# Patient Record
Sex: Female | Born: 1955 | Race: White | Hispanic: No | Marital: Single | Smoking: Current some day smoker
Health system: Southern US, Community
[De-identification: ages and names within clinical notes are randomized; demographics above are authoritative.]

## PROBLEM LIST (undated history)

## (undated) DIAGNOSIS — E119 Type 2 diabetes mellitus without complications: Secondary | ICD-10-CM

## (undated) DIAGNOSIS — I639 Cerebral infarction, unspecified: Secondary | ICD-10-CM

## (undated) DIAGNOSIS — I10 Essential (primary) hypertension: Secondary | ICD-10-CM

## (undated) DIAGNOSIS — I2510 Atherosclerotic heart disease of native coronary artery without angina pectoris: Secondary | ICD-10-CM

## (undated) DIAGNOSIS — N289 Disorder of kidney and ureter, unspecified: Secondary | ICD-10-CM

## (undated) HISTORY — PX: ABDOMINAL HYSTERECTOMY: SHX81

## (undated) HISTORY — PX: CHOLECYSTECTOMY: SHX55

## (undated) HISTORY — PX: OTHER SURGICAL HISTORY: SHX169

## (undated) HISTORY — PX: ORTHOPEDIC SURGERY: SHX850

## (undated) HISTORY — PX: HERNIA REPAIR: SHX51

---

## 2013-05-31 ENCOUNTER — Ambulatory Visit: Payer: Self-pay

## 2014-05-15 ENCOUNTER — Ambulatory Visit: Payer: Self-pay | Admitting: Family Medicine

## 2014-05-15 LAB — URINALYSIS, COMPLETE: WBC UR: >30 /HPF (ref 0–5)

## 2014-06-09 ENCOUNTER — Ambulatory Visit: Payer: Self-pay | Admitting: Physician Assistant

## 2015-05-06 ENCOUNTER — Ambulatory Visit
Admission: EM | Admit: 2015-05-06 | Discharge: 2015-05-06 | Disposition: A | Discharge status: Home / Self Care | Payer: Medicare Other | Attending: Emergency Medicine | Admitting: Emergency Medicine

## 2015-05-06 ENCOUNTER — Encounter: Payer: Self-pay | Admitting: Emergency Medicine

## 2015-05-06 DIAGNOSIS — H6992 Unspecified Eustachian tube disorder, left ear: Secondary | ICD-10-CM

## 2015-05-06 DIAGNOSIS — J320 Chronic maxillary sinusitis: Secondary | ICD-10-CM

## 2015-05-06 HISTORY — DX: Disorder of kidney and ureter, unspecified: N28.9

## 2015-05-06 HISTORY — DX: Essential (primary) hypertension: I10

## 2015-05-06 HISTORY — DX: Cerebral infarction, unspecified: I63.9

## 2015-05-06 HISTORY — DX: Atherosclerotic heart disease of native coronary artery without angina pectoris: I25.10

## 2015-05-06 HISTORY — DX: Type 2 diabetes mellitus without complications: E11.9

## 2015-05-06 MED ORDER — CEFUROXIME AXETIL 250 MG PO TABS: ORAL_TABLET | ORAL | Status: DC

## 2015-05-06 MED ORDER — CEFUROXIME AXETIL 250 MG PO TABS: 500.0000 mg | ORAL_TABLET | Freq: Two times a day (BID) | ORAL | Status: DC

## 2015-05-06 NOTE — ED Notes (Signed)
Left ear xdays

## 2015-05-06 NOTE — Discharge Instructions (Signed)

## 2015-05-06 NOTE — ED Provider Notes (Signed)
CSN: [id number]     Arrival date & time 05/06/15  1151 History   None    Chief Complaint  Patient presents with  . Otalgia   (Consider location/radiation/quality/duration/timing/severity/associated sxs/prior Treatment) HPI   This a 59 year old female who presents with a two-week history of earache sore throat and sinus infection. Her primary care physician prescribed amoxicillin for her sinus infection which she states is chronic chief in 7 days but did not find any improvement. Today she presents with left ear pain that extends down into her throat. She denies any fever or chills. She does have a copious amount of green yellow sputum from her nose. She is not coughing. She is on chronic Flonase and Nettie pot rinse and has had several episodes of a sinus infection on a yearly basis. Been considered for sinus surgery but because of the possibility of stroke she has refused that procedure. Comorbidities include diabetes kidney disease high blood pressure peripheral vascular disease and MI  Past Medical History  Diagnosis Date  . Diabetes mellitus without complication (HCC)   . Renal disorder   . Hypertension   . Coronary artery disease   . Stroke [facility])    Past Surgical History  Procedure Laterality Date  . Cholecystectomy    . Abdominal hysterectomy    . Orthopedic surgery    . Hernia repair    . Stents     History reviewed. No pertinent family history. Social History  Substance Use Topics  . Smoking status: Current Some Day Smoker  . Smokeless tobacco: None  . Alcohol Use: No   OB History    No data available     Review of Systems  Constitutional: Positive for activity change. Negative for fever, chills, diaphoresis and fatigue.  HENT: Positive for congestion, ear pain, postnasal drip, rhinorrhea, sinus pressure, sneezing and sore throat.   Musculoskeletal: Positive for arthralgias.  Allergic/Immunologic: Positive for environmental allergies.  All other systems reviewed  and are negative.   Allergies  Codeine and Vicodin  Home Medications   Prior to Admission medications   Medication Sig Start Date End Date Taking? Authorizing Provider  amLODipine-benazepril (LOTREL) 10-20 MG capsule Take 1 capsule by mouth daily.   Yes Historical Provider, MD  carvedilol (COREG CR) 10 MG 24 hr capsule Take 10 mg by mouth 2 (two) times daily.   Yes Historical Provider, MD  clopidogrel (PLAVIX) 75 MG tablet Take 75 mg by mouth daily.   Yes Historical Provider, MD  furosemide (LASIX) 20 MG tablet Take 20 mg by mouth 2 (two) times daily.   Yes Historical Provider, MD  insulin aspart (NOVOLOG) 100 UNIT/ML injection Inject into the skin 3 (three) times daily before meals.   Yes Historical Provider, MD  insulin glargine (LANTUS) 100 UNIT/ML injection Inject 33 Units into the skin at bedtime.   Yes Historical Provider, MD  levothyroxine (SYNTHROID, LEVOTHROID) 125 MCG tablet Take 125 mcg by mouth daily before breakfast.   Yes Historical Provider, MD  losartan (COZAAR) 50 MG tablet Take 50 mg by mouth 2 (two) times daily.   Yes Historical Provider, MD  PARoxetine (PAXIL) 30 MG tablet Take 15 mg by mouth daily.   Yes Historical Provider, MD  rosuvastatin (CRESTOR) 40 MG tablet Take 40 mg by mouth daily.   Yes Historical Provider, MD  spironolactone (ALDACTONE) 50 MG tablet Take 50 mg by mouth daily.   Yes Historical Provider, MD  cefUROXime (CEFTIN) 250 MG tablet Take one tablet by mouth BID with  meals 05/06/15   Lutricia FeilWilliam P Johncarlo Maalouf, PA-C   Meds Ordered and Administered this Visit  Medications - No data to display  BP 116/43 mmHg  Pulse 64  Temp(Src) 97.8 F (36.6 C) (Tympanic)  Resp 20  Ht 5\' 2"  (1.575 m)  Wt 185 lb (83.915 kg)  BMI 33.83 kg/m2  SpO2 99% No data found.   Physical Exam  Constitutional: She is oriented to person, place, and time. She appears well-developed and well-nourished. No distress.  HENT:  Head: Normocephalic and atraumatic.  Right Ear: External  ear normal.  Nose: Nose normal.  Mouth/Throat: Oropharynx is clear and moist. No oropharyngeal exudate.  Left ear shows the left TM to be slightly erythematous with an air fluid level present. She is tenderness along the jawline and into the neck along the sternocleidomastoid.  Eyes: EOM are normal. Pupils are equal, round, and reactive to light. Right eye exhibits no discharge. Left eye exhibits no discharge.  Neck: Neck supple.  Pulmonary/Chest: Effort normal and breath sounds normal. No respiratory distress. She has no wheezes. She has no rales.  Musculoskeletal: Normal range of motion. She exhibits no edema or tenderness.  Lymphadenopathy:    She has no cervical adenopathy.  Neurological: She is alert and oriented to person, place, and time.  Skin: Skin is warm and dry. She is not diaphoretic.  Psychiatric: She has a normal mood and affect. Her behavior is normal. Judgment and thought content normal.  Nursing note and vitals reviewed.   ED Course  Procedures (including critical care time)  Labs Review Labs Reviewed - No data to display  Imaging Review No results found.   Visual Acuity Review  Right Eye Distance:   Left Eye Distance:   Bilateral Distance:    Right Eye Near:   Left Eye Near:    Bilateral Near:         MDM   1. Chronic maxillary sinusitis   2. Eustachian tube disorder, left    Discharge Medication List as of 05/06/2015  1:56 PM    START taking these medications   Details  cefUROXime (CEFTIN) 250 MG tablet Take 2 tablets (500 mg total) by mouth 2 (two) times daily with a meal., Starting 05/06/2015, Until Discontinued, Print       Plan: 1. Diagnosis reviewed with patient 2. rx as per orders; risks, benefits, potential side effects reviewed with patient 3. Recommend supportive treatment with flonase,Netti pot rest and fluids. 4. F/u with PCP.  I discussed with the patient her chronic sinusitis and she was likely has eustachian tube dysfunction  due to the inflammation and retention of fluid in her left ear. I've given her a prescription for Ceftin one above his wrong and should read take 1 250 mg tablet by mouth twice a day with meals. The correct prescription was given to the patient. Will follow up with her primary care physician since this was a failure of the amoxicillin to treat the sinusitis adequately. Follow-up within a week or 2.  Lutricia FeilWilliam P Helios Kohlmann, PA-C 05/06/15 1406

## 2015-06-12 ENCOUNTER — Ambulatory Visit
Admission: EM | Admit: 2015-06-12 | Discharge: 2015-06-12 | Disposition: A | Discharge status: Home / Self Care | Payer: Medicare Other | Attending: Family Medicine | Admitting: Family Medicine

## 2015-06-12 ENCOUNTER — Ambulatory Visit: Payer: Medicare Other

## 2015-06-12 DIAGNOSIS — M7732 Calcaneal spur, left foot: Secondary | ICD-10-CM | POA: Diagnosis not present

## 2015-06-12 DIAGNOSIS — L309 Dermatitis, unspecified: Secondary | ICD-10-CM | POA: Diagnosis not present

## 2015-06-12 DIAGNOSIS — M25472 Effusion, left ankle: Secondary | ICD-10-CM | POA: Diagnosis not present

## 2015-06-12 DIAGNOSIS — M25572 Pain in left ankle and joints of left foot: Secondary | ICD-10-CM | POA: Diagnosis not present

## 2015-06-12 DIAGNOSIS — L03116 Cellulitis of left lower limb: Secondary | ICD-10-CM | POA: Diagnosis not present

## 2015-06-12 DIAGNOSIS — T148 Other injury of unspecified body region: Secondary | ICD-10-CM

## 2015-06-12 DIAGNOSIS — T148XXA Other injury of unspecified body region, initial encounter: Secondary | ICD-10-CM

## 2015-06-12 MED ORDER — CEPHALEXIN 500 MG PO CAPS: 500.0000 mg | ORAL_CAPSULE | Freq: Two times a day (BID) | ORAL | Status: AC

## 2015-06-12 MED ORDER — ACETAMINOPHEN 500 MG PO TABS: 1000.0000 mg | ORAL_TABLET | Freq: Four times a day (QID) | ORAL | Status: AC | PRN

## 2015-06-12 MED ORDER — MUPIROCIN 2 % EX OINT: TOPICAL_OINTMENT | CUTANEOUS | Status: DC

## 2015-06-12 NOTE — ED Notes (Signed)
Patient complains of left ankle pain. Patient has swelling and bruising. She states that she does not remember having an injury to this foot. She states that painful when she pushes at a certain spot and painful to put pressure on leg.

## 2015-06-12 NOTE — ED Provider Notes (Signed)
CSN: [id number]     Arrival date & time 06/12/15  1030 History   First MD Initiated Contact with Patient 06/12/15 1224     Chief Complaint  Patient presents with  . Ankle Pain   (Consider location/radiation/quality/duration/timing/severity/associated sxs/prior Treatment) HPI Comments: Single caucasian female smoker here for evaluation of left ankle pain and swelling denied trauma, rolling ankle pain with ambulation and certain positions wearing slippers today as closed toe shoes more painful smoker, diabetic fingersticks normal 117 today concerned she may have broken a bone and wants xray certain positions no pain at rest without weight bearing started yesterday PCM [facility]  FHx: M-diabetes, hyperlipidemia, HTN, COPD, thyroid, kidney disease;   The history is provided by the patient.    Past Medical History  Diagnosis Date  . Diabetes mellitus without complication (HCC)   . Renal disorder   . Hypertension   . Coronary artery disease   . Stroke [facility])    Past Surgical History  Procedure Laterality Date  . Cholecystectomy    . Abdominal hysterectomy    . Orthopedic surgery    . Hernia repair    . Stents     History reviewed. No pertinent family history. Social History  Substance Use Topics  . Smoking status: Current Some Day Smoker -- 0.50 packs/day for 30 years    Types: Cigarettes  . Smokeless tobacco: None  . Alcohol Use: No   OB History    No data available     Review of Systems  Constitutional: Negative for fever and chills.  HENT: Negative for congestion, ear pain and sore throat.   Eyes: Negative for pain and discharge.  Respiratory: Negative for cough and wheezing.   Cardiovascular: Negative for chest pain and leg swelling.  Gastrointestinal: Negative for nausea, vomiting, diarrhea, constipation and blood in stool.  Endocrine: Negative for polydipsia, polyphagia and polyuria.  Genitourinary: Negative for dysuria, hematuria and difficulty urinating.    Musculoskeletal: Positive for myalgias, joint swelling, arthralgias and gait problem. Negative for back pain, neck pain and neck stiffness.  Skin: Negative for color change, pallor, rash and wound.  Allergic/Immunologic: Negative for environmental allergies and food allergies.  Neurological: Negative for headaches.  Hematological: Negative for adenopathy. Does not bruise/bleed easily.  Psychiatric/Behavioral: Negative for confusion, sleep disturbance and agitation. The patient is not nervous/anxious.     Allergies  Codeine and Vicodin  Home Medications   Prior to Admission medications   Medication Sig Start Date End Date Taking? Authorizing Provider  amLODipine-benazepril (LOTREL) 10-20 MG capsule Take 1 capsule by mouth daily.   Yes Historical Provider, MD  carvedilol (COREG CR) 10 MG 24 hr capsule Take 10 mg by mouth 2 (two) times daily.   Yes Historical Provider, MD  clopidogrel (PLAVIX) 75 MG tablet Take 75 mg by mouth daily.   Yes Historical Provider, MD  furosemide (LASIX) 20 MG tablet Take 20 mg by mouth 2 (two) times daily.   Yes Historical Provider, MD  insulin aspart (NOVOLOG) 100 UNIT/ML injection Inject into the skin 3 (three) times daily before meals.   Yes Historical Provider, MD  insulin glargine (LANTUS) 100 UNIT/ML injection Inject 33 Units into the skin at bedtime.   Yes Historical Provider, MD  levothyroxine (SYNTHROID, LEVOTHROID) 125 MCG tablet Take 125 mcg by mouth daily before breakfast.   Yes Historical Provider, MD  losartan (COZAAR) 50 MG tablet Take 50 mg by mouth 2 (two) times daily.   Yes Historical Provider, MD  PARoxetine (PAXIL)  30 MG tablet Take 15 mg by mouth daily.   Yes Historical Provider, MD  rosuvastatin (CRESTOR) 40 MG tablet Take 40 mg by mouth daily.   Yes Historical Provider, MD  spironolactone (ALDACTONE) 50 MG tablet Take 50 mg by mouth daily.   Yes Historical Provider, MD  acetaminophen (TYLENOL) 500 MG tablet Take 2 tablets (1,000 mg total)  by mouth every 6 (six) hours as needed for mild pain, moderate pain or fever. 06/12/15 06/19/15  Barbaraann Barthel, NP  cephALEXin (KEFLEX) 500 MG capsule Take 1 capsule (500 mg total) by mouth 2 (two) times daily. 06/12/15 06/19/15  Barbaraann Barthel, NP  mupirocin ointment (BACTROBAN) 2 % Apply to left foot/ankle affected area twice a day until healed 06/12/15   Barbaraann Barthel, NP   Meds Ordered and Administered this Visit  Medications - No data to display  BP 120/54 mmHg  Pulse 64  Temp(Src) 97 F (36.1 C) (Tympanic)  Resp 16  Ht 5\' 2"  (1.575 m)  Wt 182 lb (82.555 kg)  BMI 33.28 kg/m2  SpO2 98% No data found.   Physical Exam  Constitutional: She is oriented to person, place, and time. Vital signs are normal. She appears well-developed and well-nourished. No distress.  HENT:  Head: Normocephalic and atraumatic.  Right Ear: External ear normal.  Left Ear: External ear normal.  Nose: Nose normal.  Mouth/Throat: No oropharyngeal exudate.  Eyes: Conjunctivae, EOM and lids are normal. Pupils are equal, round, and reactive to light. Right eye exhibits no discharge. Left eye exhibits no discharge. No scleral icterus.  Neck: Trachea normal and normal range of motion. Neck supple. No tracheal deviation present.  Cardiovascular: Normal rate, regular rhythm, normal heart sounds and intact distal pulses.   Pulses:      Dorsalis pedis pulses are 2+ on the right side, and 2+ on the left side.       Posterior tibial pulses are 2+ on the right side, and 2+ on the left side.  Pulmonary/Chest: Effort normal and breath sounds normal. No stridor. No respiratory distress. She has no wheezes. She has no rales. She exhibits no tenderness.  Abdominal: Soft.  Musculoskeletal: Normal range of motion. She exhibits edema and tenderness.       Right hip: Normal.       Left hip: Normal.       Right knee: Normal.       Left knee: Normal.       Right ankle: Normal.       Left ankle: She exhibits  swelling. She exhibits normal range of motion, no ecchymosis, no deformity, no laceration and normal pulse. Tenderness. No lateral malleolus, no medial malleolus, no head of 5th metatarsal and no proximal fibula tenderness found. Achilles tendon normal. Achilles tendon exhibits no pain and no defect.       Right lower leg: Normal.       Left lower leg: Normal.       Right foot: Normal.       Left foot: There is tenderness and swelling. There is normal range of motion, no bony tenderness, normal capillary refill, no crepitus, no deformity and no laceration.       Feet:  Neurological: She is alert and oriented to person, place, and time. She displays no atrophy and no tremor. No cranial nerve deficit or sensory deficit. She exhibits normal muscle tone. She displays no seizure activity. Coordination and gait normal. GCS eye subscore is 4. GCS verbal subscore is  5. GCS motor subscore is 6.  5/5 strength bilatearlly AROM x 4 equal  Skin: Skin is warm and dry. Abrasion, bruising, ecchymosis and rash noted. No burn, no laceration, no lesion and no purpura noted. Rash is macular. Rash is not papular, not maculopapular, not nodular, not pustular, not vesicular and not urticarial. She is not diaphoretic. There is erythema. No cyanosis. No pallor. Nails show no clubbing.  Bilateral feet calloused with dry flaking skin; left dorsum foot with nonpitting edema ecchymosis fine macular erythema increased warm TTP 2cm diameter x 2 groupings around abrasions  Psychiatric: She has a normal mood and affect. Her speech is normal and behavior is normal. Judgment and thought content normal. Cognition and memory are normal.  Nursing note and vitals reviewed.   ED Course  Procedures (including critical care time)  Labs Review Labs Reviewed - No data to display  Imaging Review Dg Ankle Complete Left  06/12/2015  CLINICAL DATA:  One week history of left ankle pain. No history of trauma EXAM: LEFT ANKLE COMPLETE - 3+  VIEW COMPARISON:  None. FINDINGS: Frontal, oblique, and lateral views were obtained. There is no demonstrable fracture or joint effusion. The ankle mortise appears intact. There are small spurs arising from the posterior and inferior calcaneus. There is calcification just posterior to the upper calcaneus, likely focal calcific tendinosis. IMPRESSION: Small calcaneal spurs.  No fracture.  Mortise intact. Electronically Signed   By: Bretta BangWilliam  Woodruff III M.D.   On: 06/12/2015 13:05   1312 discussed xray results with patient/plantar fasciities pathophysiology and treatment.  Arthritis treatment.  Given copy of radiology report.  Patient verbalized understanding of information/instructions, agreed with plan of care and had no further questions at this time.    MDM   1. Cellulitis of left lower extremity   2. Calcaneal spur of foot, left   3. Abrasion   4. Dermatitis    Abrasion/dry skin/scratching initial point of entry anterior foot.  Elevate foot when sitting.  If TED hose causing pain avoid use until swelling decreased.  May try cryotherapy.  Discussed emollient use for dry skin avoid pump/fragranced lotions.  Exitcare handout on skin infection given to patient.  RTC if worsening erythema, pain, purulent discharge, fever, blood sugars elevated more than usual could expect slight bump but not drastic increase expected.  Do not soak abrasion in dirty water e.g. Hot tub, pool, river, lake.  May apply bactroban or neosporin to affected areas BID until healed.  Wash towels, washcloths, sheets in hot water with bleach every couple of days until infection resolved.  Patient verbalized understanding, agreed with plan of care and had no further questions at this time.    Tylenol 500-1000mg  po QID prn pain.  Discussed stretches and icing.  Follow up with PCM if no improvement with discussed care for podiatry referral.  Consider new supportive footwear/OTC inserts if shoe treads worn out/greater than 59 year old.   Discussed calcaneal spurs/plantar fasciitis with patient.  Heat therapy gentle rom helps arthritis along with glucosamine chondroitin for 50% of patients. Patient verbalized understanding of instructions, agreed with plan of care and had no further questions at this time.    Continue home monitoring of blood sugars and bring log to next appt with PCM.  Perform daily feet checks.  Encouraged to continue exercise routine and wear supportive shoes.  Moisturize skin to keep healthy and prevent sores/breakdown avoid pump lotions as alcohol content higher and fragrances can also irritate already dry skin.  Annual  routine diabetic eye exam when due.  Patient verbalized understanding of information/instructions/ agreed with plan of care and had no further questions at this time.  Barbaraann Barthel, NP 06/13/15 1943

## 2015-06-12 NOTE — Discharge Instructions (Signed)
Cellulitis Cellulitis is an infection of the skin and the tissue beneath it. The infected area is usually red and tender. Cellulitis occurs most often in the arms and lower legs.  CAUSES  Cellulitis is caused by bacteria that enter the skin through cracks or cuts in the skin. The most common types of bacteria that cause cellulitis are staphylococci and streptococci. SIGNS AND SYMPTOMS   Redness and warmth.  Swelling.  Tenderness or pain.  Fever. DIAGNOSIS  Your health care provider can usually determine what is wrong based on a physical exam. Blood tests may also be done. TREATMENT  Treatment usually involves taking an antibiotic medicine. HOME CARE INSTRUCTIONS   Take your antibiotic medicine as directed by your health care provider. Finish the antibiotic even if you start to feel better.  Keep the infected arm or leg elevated to reduce swelling.  Apply a warm cloth to the affected area up to 4 times per day to relieve pain.  Take medicines only as directed by your health care provider.  Keep all follow-up visits as directed by your health care provider. SEEK MEDICAL CARE IF:   You notice red streaks coming from the infected area.  Your red area gets larger or turns dark in color.  Your bone or joint underneath the infected area becomes painful after the skin has healed.  Your infection returns in the same area or another area.  You notice a swollen bump in the infected area.  You develop new symptoms.  You have a fever. SEEK IMMEDIATE MEDICAL CARE IF:   You feel very sleepy.  You develop vomiting or diarrhea.  You have a general ill feeling (malaise) with muscle aches and pains.   This information is not intended to replace advice given to you by your health care provider. Make sure you discuss any questions you have with your health care provider.   Document Released: 03/28/2005 Document Revised: 03/09/2015 Document Reviewed: 09/03/2011 Elsevier Interactive  Patient Education 2016 [employer] Cryotherapy Cryotherapy means treatment with cold. Ice or gel packs can be used to reduce both pain and swelling. Ice is the most helpful within the first 24 to 48 hours after an injury or flare-up from overusing a muscle or joint. Sprains, strains, spasms, burning pain, shooting pain, and aches can all be eased with ice. Ice can also be used when recovering from surgery. Ice is effective, has very few side effects, and is safe for most people to use. PRECAUTIONS  Ice is not a safe treatment option for people with:  Raynaud phenomenon. This is a condition affecting small blood vessels in the extremities. Exposure to cold may cause your problems to return.  Cold hypersensitivity. There are many forms of cold hypersensitivity, including:  Cold urticaria. Red, itchy hives appear on the skin when the tissues begin to warm after being iced.  Cold erythema. This is a red, itchy rash caused by exposure to cold.  Cold hemoglobinuria. Red blood cells break down when the tissues begin to warm after being iced. The hemoglobin that carry oxygen are passed into the urine because they cannot combine with blood proteins fast enough.  Numbness or altered sensitivity in the area being iced. If you have any of the following conditions, do not use ice until you have discussed cryotherapy with your caregiver:  Heart conditions, such as arrhythmia, angina, or chronic heart disease.  High blood pressure.  Healing wounds or open skin in the area being iced.  Current infections.  Rheumatoid  arthritis.  Poor circulation.  Diabetes. Ice slows the blood flow in the region it is applied. This is beneficial when trying to stop inflamed tissues from spreading irritating chemicals to surrounding tissues. However, if you expose your skin to cold temperatures for too long or without the proper protection, you can damage your skin or nerves. Watch for signs of skin damage due to  cold. HOME CARE INSTRUCTIONS Follow these tips to use ice and cold packs safely.  Place a dry or damp towel between the ice and skin. A damp towel will cool the skin more quickly, so you may need to shorten the time that the ice is used.  For a more rapid response, add gentle compression to the ice.  Ice for no more than 10 to 20 minutes at a time. The bonier the area you are icing, the less time it will take to get the benefits of ice.  Check your skin after 5 minutes to make sure there are no signs of a poor response to cold or skin damage.  Rest 20 minutes or more between uses.  Once your skin is numb, you can end your treatment. You can test numbness by very lightly touching your skin. The touch should be so light that you do not see the skin dimple from the pressure of your fingertip. When using ice, most people will feel these normal sensations in this order: cold, burning, aching, and numbness.  Do not use ice on someone who cannot communicate their responses to pain, such as small children or people with dementia. HOW TO MAKE AN ICE PACK Ice packs are the most common way to use ice therapy. Other methods include ice massage, ice baths, and cryosprays. Muscle creams that cause a cold, tingly feeling do not offer the same benefits that ice offers and should not be used as a substitute unless recommended by your caregiver. To make an ice pack, do one of the following:  Place crushed ice or a bag of frozen vegetables in a sealable plastic bag. Squeeze out the excess air. Place this bag inside another plastic bag. Slide the bag into a pillowcase or place a damp towel between your skin and the bag.  Mix 3 parts water with 1 part rubbing alcohol. Freeze the mixture in a sealable plastic bag. When you remove the mixture from the freezer, it will be slushy. Squeeze out the excess air. Place this bag inside another plastic bag. Slide the bag into a pillowcase or place a damp towel between your  skin and the bag. SEEK MEDICAL CARE IF:  You develop white spots on your skin. This may give the skin a blotchy (mottled) appearance.  Your skin turns blue or pale.  Your skin becomes waxy or hard.  Your swelling gets worse. MAKE SURE YOU:   Understand these instructions.  Will watch your condition.  Will get help right away if you are not doing well or get worse.   This information is not intended to replace advice given to you by your health care provider. Make sure you discuss any questions you have with your health care provider.   Document Released: 02/12/2011 Document Revised: 07/09/2014 Document Reviewed: 02/12/2011 Elsevier Interactive Patient Education [employer]2016 [employer] Inc[employer]

## 2016-09-01 IMAGING — CR DG ANKLE COMPLETE 3+V*L*
3 series · 3 of 3 positions shown · non-contrast
Comparison: None.

CLINICAL DATA: One week history of left ankle pain. No history of
trauma

EXAM:
LEFT ANKLE COMPLETE - 3+ VIEW

[ankle ap]
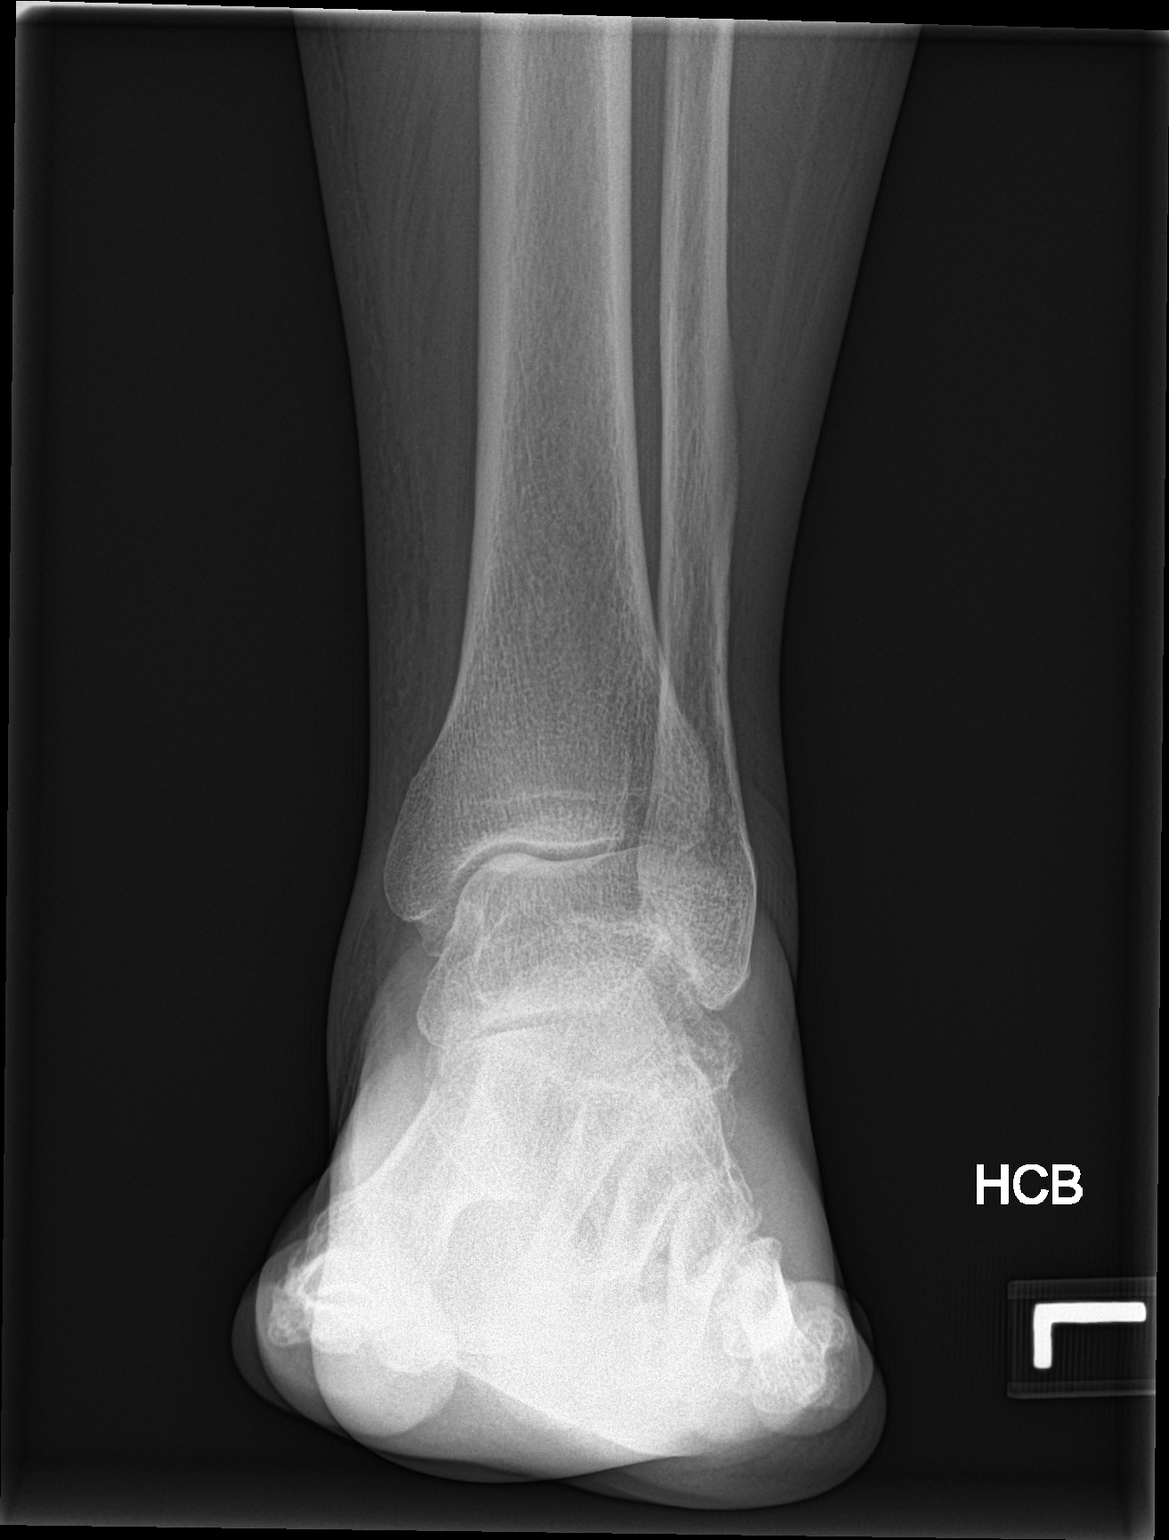

[ankle obl]
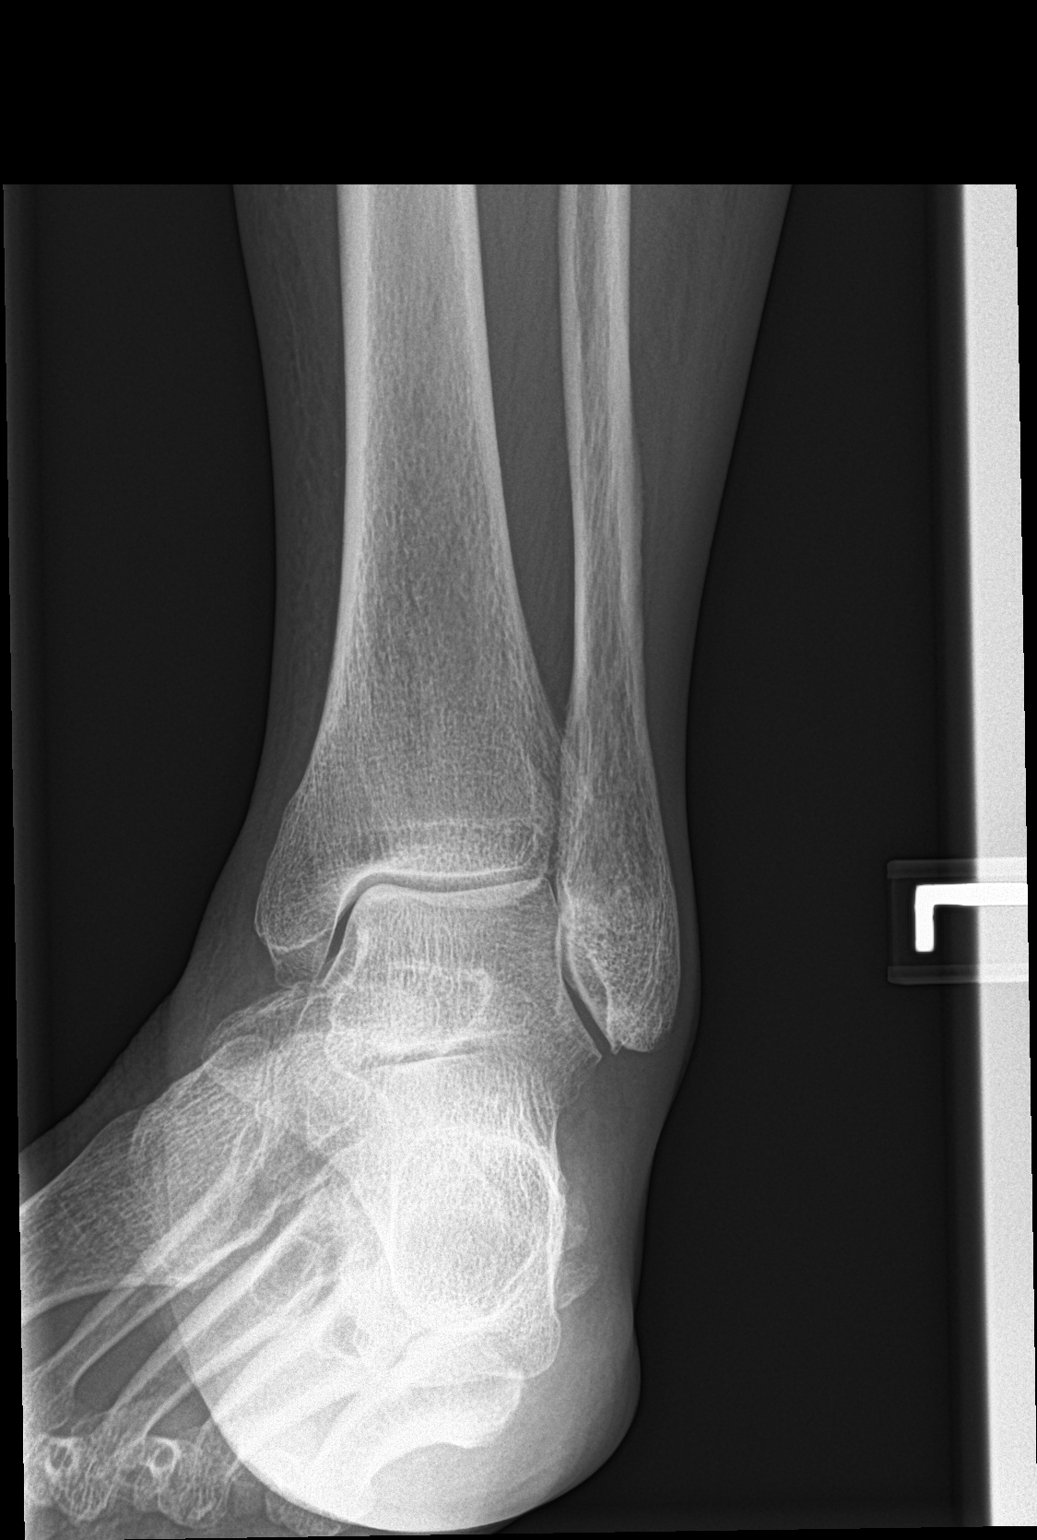

[ankle lat]
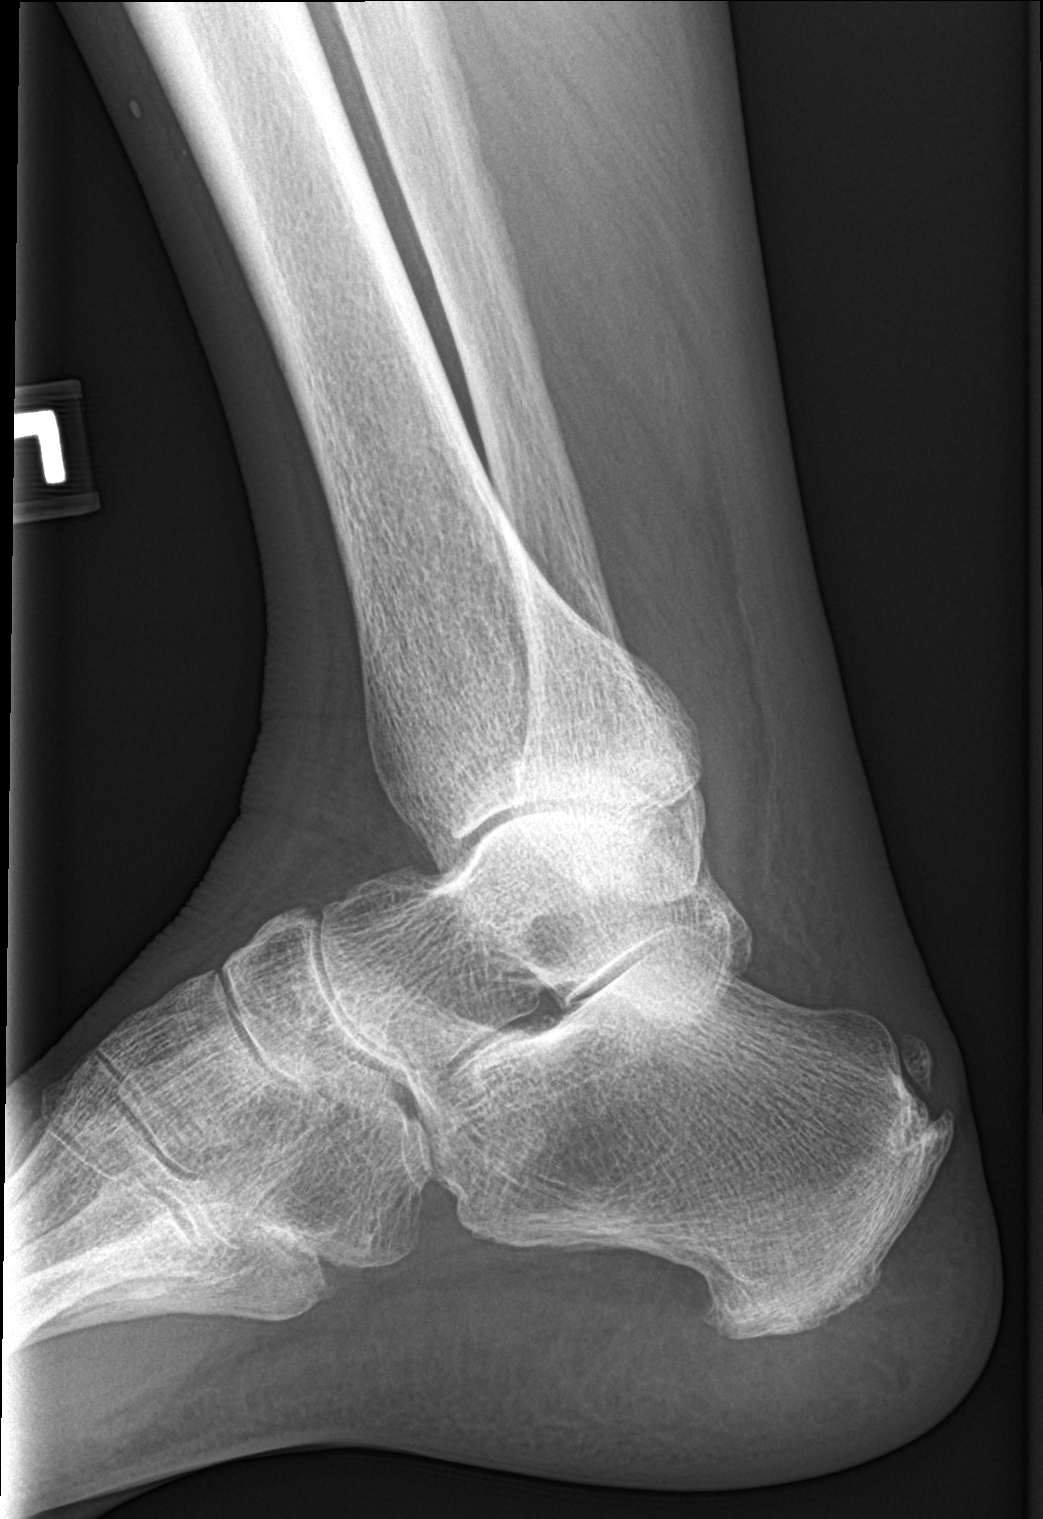

[3 of 3 positions shown; findings below may reference images not displayed]

FINDINGS: Frontal, oblique, and lateral views were obtained. There is no
demonstrable fracture or joint effusion. The ankle mortise appears
intact. There are small spurs arising from the posterior and
inferior calcaneus. There is calcification just posterior to the
upper calcaneus, likely focal calcific tendinosis.
IMPRESSION: Small calcaneal spurs.  No fracture.  Mortise intact.

## 2017-09-18 ENCOUNTER — Ambulatory Visit
Admission: EM | Admit: 2017-09-18 | Discharge: 2017-09-18 | Disposition: A | Discharge status: Home / Self Care | Payer: Medicare Other | Attending: Family Medicine | Admitting: Family Medicine

## 2017-09-18 ENCOUNTER — Other Ambulatory Visit: Payer: Self-pay

## 2017-09-18 DIAGNOSIS — M26621 Arthralgia of right temporomandibular joint: Secondary | ICD-10-CM

## 2017-09-18 DIAGNOSIS — H6691 Otitis media, unspecified, right ear: Secondary | ICD-10-CM | POA: Diagnosis not present

## 2017-09-18 DIAGNOSIS — J309 Allergic rhinitis, unspecified: Secondary | ICD-10-CM

## 2017-09-18 MED ORDER — AMOXICILLIN-POT CLAVULANATE 875-125 MG PO TABS: 1.0000 | ORAL_TABLET | Freq: Two times a day (BID) | ORAL | 0 refills | Status: AC

## 2017-09-18 NOTE — ED Provider Notes (Signed)
[facility] ____________________________________________  Time seen: Approximately 3:50 PM  I have reviewed the triage vital signs and the nursing notes.   HISTORY  Chief Complaint Otalgia (Right)   HPI Cathy Lawson is a 62 y.o. female presenting for evaluation of right ear pain that started yesterday evening.  Patient reports she has chronic seasonal allergies and has been having runny nose, nasal congestion and occasional cough that has been present for several days, and reports is consistent with her normal allergies.  States right ear pain is atypical for her allergies.  Denies associated fevers.  States pain also goes into her right jaw.  No trauma.  Continues to eat and drink well overall.  Denies other aggravating or alleviating factors.  Has not taken any over-the-counter medications for the same complaints. Denies chest pain, shortness of breath, abdominal pain, dysuria, extremity pain, extremity swelling or rash. Denies recent sickness. Denies recent antibiotic use.      Past Medical History:  Diagnosis Date  . Coronary artery disease   . Diabetes mellitus without complication (HCC)   . Hypertension   . Renal disorder   . Stroke [facility](HCC)     There are no active problems to display for this patient.   Past Surgical History:  Procedure Laterality Date  . ABDOMINAL HYSTERECTOMY    . CHOLECYSTECTOMY    . HERNIA REPAIR    . ORTHOPEDIC SURGERY    . stents       No current facility-administered medications for this encounter.   Current Outpatient Medications:  .  amLODipine-benazepril (LOTREL) 10-20 MG capsule, Take 1 capsule by mouth daily., Disp: , Rfl:  .  carvedilol (COREG CR) 10 MG 24 hr capsule, Take 10 mg by mouth 2 (two) times daily., Disp: , Rfl:  .  clopidogrel (PLAVIX) 75 MG tablet, Take 75 mg by mouth daily., Disp: , Rfl:  .  ethacrynic acid (EDECRIN) 25 MG tablet, Take 25 mg by mouth 2 (two) times daily., Disp: , Rfl:  .  insulin aspart  (NOVOLOG) 100 UNIT/ML injection, Inject into the skin 3 (three) times daily before meals., Disp: , Rfl:  .  insulin glargine (LANTUS) 100 UNIT/ML injection, Inject 33 Units into the skin at bedtime., Disp: , Rfl:  .  levothyroxine (SYNTHROID, LEVOTHROID) 125 MCG tablet, Take 125 mcg by mouth daily before breakfast., Disp: , Rfl:  .  PARoxetine (PAXIL) 30 MG tablet, Take 15 mg by mouth daily., Disp: , Rfl:  .  rosuvastatin (CRESTOR) 40 MG tablet, Take 40 mg by mouth daily., Disp: , Rfl:  .  spironolactone (ALDACTONE) 50 MG tablet, Take 50 mg by mouth daily., Disp: , Rfl:  .  amoxicillin-clavulanate (AUGMENTIN) 875-125 MG tablet, Take 1 tablet by mouth every 12 (twelve) hours., Disp: 20 tablet, Rfl: 0  Allergies Codeine and Vicodin [hydrocodone-acetaminophen]  History reviewed. No pertinent family history.  Social History Social History   Tobacco Use  . Smoking status: Current Some Day Smoker    Packs/day: 0.50    Years: 30.00    Pack years: 15.00    Types: Cigarettes  . Smokeless tobacco: Never Used  Substance Use Topics  . Alcohol use: No    Alcohol/week: 0.0 oz  . Drug use: No    Review of Systems Constitutional: No fever/chills ENT: No sore throat.  As above Cardiovascular: Denies chest pain. Respiratory: Denies shortness of breath. Gastrointestinal: No abdominal pain.  Skin: Negative for rash.  ____________________________________________   PHYSICAL EXAM:  VITAL SIGNS: ED  Triage Vitals  Enc Vitals Group     BP 09/18/17 1452 (!) 171/60     Pulse Rate 09/18/17 1452 76     Resp 09/18/17 1452 18     Temp 09/18/17 1452 98.8 F (37.1 C)     Temp Source 09/18/17 1452 Oral     SpO2 09/18/17 1452 100 %     Weight 09/18/17 1450 190 lb (86.2 kg)     Height 09/18/17 1450 5\' 2"  (1.575 m)     Head Circumference --      Peak Flow --      Pain Score 09/18/17 1450 8     Pain Loc --      Pain Edu? --      Excl. in GC? --    Constitutional: Alert and oriented. Well  appearing and in no acute distress. Eyes: Conjunctivae are normal.  Head: Atraumatic.  Mild bilateral frontal and maxillary sinus tenderness to palpation. No swelling. No erythema.  Mild right TMJ tenderness, no left TMJ tenderness, full range of motion present.  Ears: Left: Nontender, normal canal, minimal erythema, otherwise normal TM.Right: Nontender, normal canal, moderate erythema and dull TM.  No mastoid tenderness bilaterally.  Nose:Nasal congestion   Mouth/Throat: Mucous membranes are moist. No pharyngeal erythema. No tonsillar swelling or exudate.  Neck: No stridor.  No cervical spine tenderness to palpation. Hematological/Lymphatic/Immunilogical: No cervical lymphadenopathy. Cardiovascular: Normal rate, regular rhythm. Grossly normal heart sounds.  Good peripheral circulation. Respiratory: Normal respiratory effort.  No retractions. No wheezes, rales or rhonchi. Good air movement.  Musculoskeletal: Ambulatory with steady gait.  Neurologic:  Normal speech and language. No gait instability. Skin:  Skin appears warm, dry and intact. No rash noted. Psychiatric: Mood and affect are normal. Speech and behavior are normal.  ___________________________________________   LABS (all labs ordered are listed, but only abnormal results are displayed)  Labs Reviewed - No data to display  PROCEDURES Procedures   INITIAL IMPRESSION / ASSESSMENT AND PLAN / ED COURSE  Pertinent labs & imaging results that were available during my care of the patient were reviewed by me and considered in my medical decision making (see chart for details).  Well-appearing patient.  No acute distress.  Suspect chronic allergic rhinitis with right otitis media noted as well as right TMJ tenderness.  Will treat with oral Augmentin is also does have some sinus tenderness upon chronic allergies.  Encourage over-the-counter Tylenol and supportive care as needed.Discussed indication, risks and benefits of medications  with patient.  Discussed follow up with Primary care physician this week. Discussed follow up and return parameters including no resolution or any worsening concerns. Patient verbalized understanding and agreed to plan.   ____________________________________________   FINAL CLINICAL IMPRESSION(S) / ED DIAGNOSES  Final diagnoses:  Right otitis media, unspecified otitis media type  Arthralgia of right temporomandibular joint  Allergic rhinitis, unspecified seasonality, unspecified trigger     ED Discharge Orders        Ordered    amoxicillin-clavulanate (AUGMENTIN) 875-125 MG tablet  Every 12 hours     09/18/17 1555       Note: This dictation was prepared with Dragon dictation along with smaller phrase technology. Any transcriptional errors that result from this process are unintentional.         Renford Dills, NP 09/18/17 [phone]

## 2017-09-18 NOTE — ED Triage Notes (Signed)
Patient complains of right ear that started last night and radiates down her face.

## 2017-09-18 NOTE — Discharge Instructions (Signed)
Take medication as prescribed. Rest. Drink plenty of fluids.  ° °Follow up with your primary care physician this week as needed. Return to Urgent care for new or worsening concerns.  ° °

## 2018-04-28 ENCOUNTER — Other Ambulatory Visit: Payer: Self-pay

## 2018-04-28 ENCOUNTER — Ambulatory Visit: Payer: Medicare Other

## 2018-04-28 ENCOUNTER — Ambulatory Visit
Admission: EM | Admit: 2018-04-28 | Discharge: 2018-04-28 | Disposition: A | Discharge status: Home / Self Care | Payer: Medicare Other | Attending: Family Medicine | Admitting: Family Medicine

## 2018-04-28 DIAGNOSIS — I10 Essential (primary) hypertension: Secondary | ICD-10-CM | POA: Diagnosis not present

## 2018-04-28 DIAGNOSIS — Z79899 Other long term (current) drug therapy: Secondary | ICD-10-CM | POA: Diagnosis not present

## 2018-04-28 DIAGNOSIS — M79671 Pain in right foot: Secondary | ICD-10-CM | POA: Diagnosis present

## 2018-04-28 DIAGNOSIS — Z8673 Personal history of transient ischemic attack (TIA), and cerebral infarction without residual deficits: Secondary | ICD-10-CM | POA: Insufficient documentation

## 2018-04-28 DIAGNOSIS — Z79890 Hormone replacement therapy (postmenopausal): Secondary | ICD-10-CM | POA: Insufficient documentation

## 2018-04-28 DIAGNOSIS — Z9049 Acquired absence of other specified parts of digestive tract: Secondary | ICD-10-CM | POA: Diagnosis not present

## 2018-04-28 DIAGNOSIS — Z7902 Long term (current) use of antithrombotics/antiplatelets: Secondary | ICD-10-CM | POA: Diagnosis not present

## 2018-04-28 DIAGNOSIS — S9031XA Contusion of right foot, initial encounter: Secondary | ICD-10-CM

## 2018-04-28 DIAGNOSIS — Z90710 Acquired absence of both cervix and uterus: Secondary | ICD-10-CM | POA: Diagnosis not present

## 2018-04-28 DIAGNOSIS — W1841XA Slipping, tripping and stumbling without falling due to stepping on object, initial encounter: Secondary | ICD-10-CM | POA: Diagnosis not present

## 2018-04-28 DIAGNOSIS — Z885 Allergy status to narcotic agent status: Secondary | ICD-10-CM | POA: Diagnosis not present

## 2018-04-28 DIAGNOSIS — W228XXA Striking against or struck by other objects, initial encounter: Secondary | ICD-10-CM | POA: Diagnosis not present

## 2018-04-28 DIAGNOSIS — I2510 Atherosclerotic heart disease of native coronary artery without angina pectoris: Secondary | ICD-10-CM | POA: Diagnosis not present

## 2018-04-28 DIAGNOSIS — Z794 Long term (current) use of insulin: Secondary | ICD-10-CM | POA: Diagnosis not present

## 2018-04-28 DIAGNOSIS — E119 Type 2 diabetes mellitus without complications: Secondary | ICD-10-CM | POA: Diagnosis not present

## 2018-04-28 DIAGNOSIS — S93601A Unspecified sprain of right foot, initial encounter: Secondary | ICD-10-CM

## 2018-04-28 DIAGNOSIS — F17210 Nicotine dependence, cigarettes, uncomplicated: Secondary | ICD-10-CM | POA: Diagnosis not present

## 2018-04-28 NOTE — ED Provider Notes (Signed)
[facility]    CSN: [id number] Arrival date & time: 04/28/18  1319     History   Chief Complaint Chief Complaint  Patient presents with  . Foot Pain    right    HPI Cathy Lawson is a 62 y.o. female.   62 yo female with a c/o right foot pain for 2 weeks since injuring it 2 weeks ago. States she tripped over the granddaughters toys.   The history is provided by the patient.  Foot Pain     Past Medical History:  Diagnosis Date  . Coronary artery disease   . Diabetes mellitus without complication (HCC)   . Hypertension   . Renal disorder   . Stroke [facility])     There are no active problems to display for this patient.   Past Surgical History:  Procedure Laterality Date  . ABDOMINAL HYSTERECTOMY    . CHOLECYSTECTOMY    . HERNIA REPAIR    . ORTHOPEDIC SURGERY    . stents      OB History   None      Home Medications    Prior to Admission medications   Medication Sig Start Date End Date Taking? Authorizing Provider  amLODipine-benazepril (LOTREL) 10-20 MG capsule Take 1 capsule by mouth daily.   Yes [provider]  carvedilol (COREG CR) 10 MG 24 hr capsule Take 10 mg by mouth 2 (two) times daily.   Yes [provider]  clopidogrel (PLAVIX) 75 MG tablet Take 75 mg by mouth daily.   Yes [provider]  ethacrynic acid (EDECRIN) 25 MG tablet Take 25 mg by mouth 2 (two) times daily.   Yes [provider]  insulin aspart (NOVOLOG) 100 UNIT/ML injection Inject into the skin 3 (three) times daily before meals.   Yes [provider]  insulin glargine (LANTUS) 100 UNIT/ML injection Inject 33 Units into the skin at bedtime.   Yes [provider]  levothyroxine (SYNTHROID, LEVOTHROID) 125 MCG tablet Take 125 mcg by mouth daily before breakfast.   Yes [provider]  liraglutide (VICTOZA) 18 MG/3ML SOPN Inject into the skin. 12/26/17 12/26/18 Yes [provider]  PARoxetine (PAXIL)  30 MG tablet Take 15 mg by mouth daily.   Yes [provider]  rosuvastatin (CRESTOR) 40 MG tablet Take 40 mg by mouth daily.   Yes [provider]  amoxicillin-clavulanate (AUGMENTIN) 875-125 MG tablet Take 1 tablet by mouth every 12 (twelve) hours. 09/18/17   Renford Dills, NP  spironolactone (ALDACTONE) 50 MG tablet Take 50 mg by mouth daily.    [provider]    Family History History reviewed. No pertinent family history.  Social History Social History   Tobacco Use  . Smoking status: Current Some Day Smoker    Packs/day: 0.50    Years: 30.00    Pack years: 15.00    Types: Cigarettes  . Smokeless tobacco: Never Used  Substance Use Topics  . Alcohol use: No    Alcohol/week: 0.0 standard drinks  . Drug use: No     Allergies   Codeine and Vicodin [hydrocodone-acetaminophen]   Review of Systems Review of Systems   Physical Exam Triage Vital Signs ED Triage Vitals  Enc Vitals Group     BP 04/28/18 1333 (!) 170/73     Pulse Rate 04/28/18 1333 65     Resp 04/28/18 1333 18     Temp 04/28/18 1333 98.2 F (36.8 C)     Temp  Source 04/28/18 1333 Oral     SpO2 04/28/18 1333 97 %     Weight 04/28/18 1328 190 lb (86.2 kg)     Height 04/28/18 1328 5\' 2"  (1.575 m)     Head Circumference --      Peak Flow --      Pain Score 04/28/18 1328 5     Pain Loc --      Pain Edu? --      Excl. in GC? --    No data found.  Updated Vital Signs BP (!) 170/73 (BP Location: Right Arm)   Pulse 65   Temp 98.2 F (36.8 C) (Oral)   Resp 18   Ht 5\' 2"  (1.575 m)   Wt 86.2 kg   SpO2 97%   BMI 34.75 kg/m   Visual Acuity Right Eye Distance:   Left Eye Distance:   Bilateral Distance:    Right Eye Near:   Left Eye Near:    Bilateral Near:     Physical Exam  Constitutional: She appears well-developed and well-nourished. No distress.  Musculoskeletal:       Right foot: There is tenderness and bony tenderness. There is normal range of motion, no  swelling, normal capillary refill, no crepitus, no deformity and no laceration.  Skin: She is not diaphoretic.  Nursing note and vitals reviewed.    UC Treatments / Results  Labs (all labs ordered are listed, but only abnormal results are displayed) Labs Reviewed - No data to display  EKG None  Radiology Dg Foot Complete Right  Result Date: 04/28/2018 CLINICAL DATA:  Fall, pain EXAM: RIGHT FOOT COMPLETE - 3+ VIEW COMPARISON:  None. FINDINGS: Negative for fracture. Moderate spurring in the calcaneus. Mild degenerative change first MTP. IMPRESSION: No acute abnormality. Electronically Signed   By: Marlan Palau M.D.   On: 04/28/2018 13:55    Procedures Procedures (including critical care time)  Medications Ordered in UC Medications - No data to display  Initial Impression / Assessment and Plan / UC Course  I have reviewed the triage vital signs and the nursing notes.  Pertinent labs & imaging results that were available during my care of the patient were reviewed by me and considered in my medical decision making (see chart for details).      Final Clinical Impressions(s) / UC Diagnoses   Final diagnoses:  Contusion of right foot, initial encounter  Sprain of right foot, initial encounter     Discharge Instructions     Rest, heat, over the counter ibuprofen as needed    ED Prescriptions    None      1. diagnosis reviewed with patient 2. Recommend supportive treatment as above  3. Follow-up prn if symptoms worsen or don't improve   Controlled Substance Prescriptions [place] Controlled Substance Registry consulted? Not Applicable   Payton Mccallum, MD 04/28/18 1436

## 2018-04-28 NOTE — Discharge Instructions (Signed)
Rest, heat, over the counter ibuprofen as needed

## 2018-04-28 NOTE — ED Triage Notes (Signed)
Patient complains of right foot pain that occurred after a fall 2 weeks ago. States that she tripped over her granddaughter toys. Patient states that she has pain at base of toes. Patient states that pain has remained constant.

## 2019-07-19 IMAGING — CR DG FOOT COMPLETE 3+V*R*
3 series · 4 of 4 positions shown · non-contrast
Comparison: None.

CLINICAL DATA: Fall, pain

EXAM:
RIGHT FOOT COMPLETE - 3+ VIEW

[foot ap]
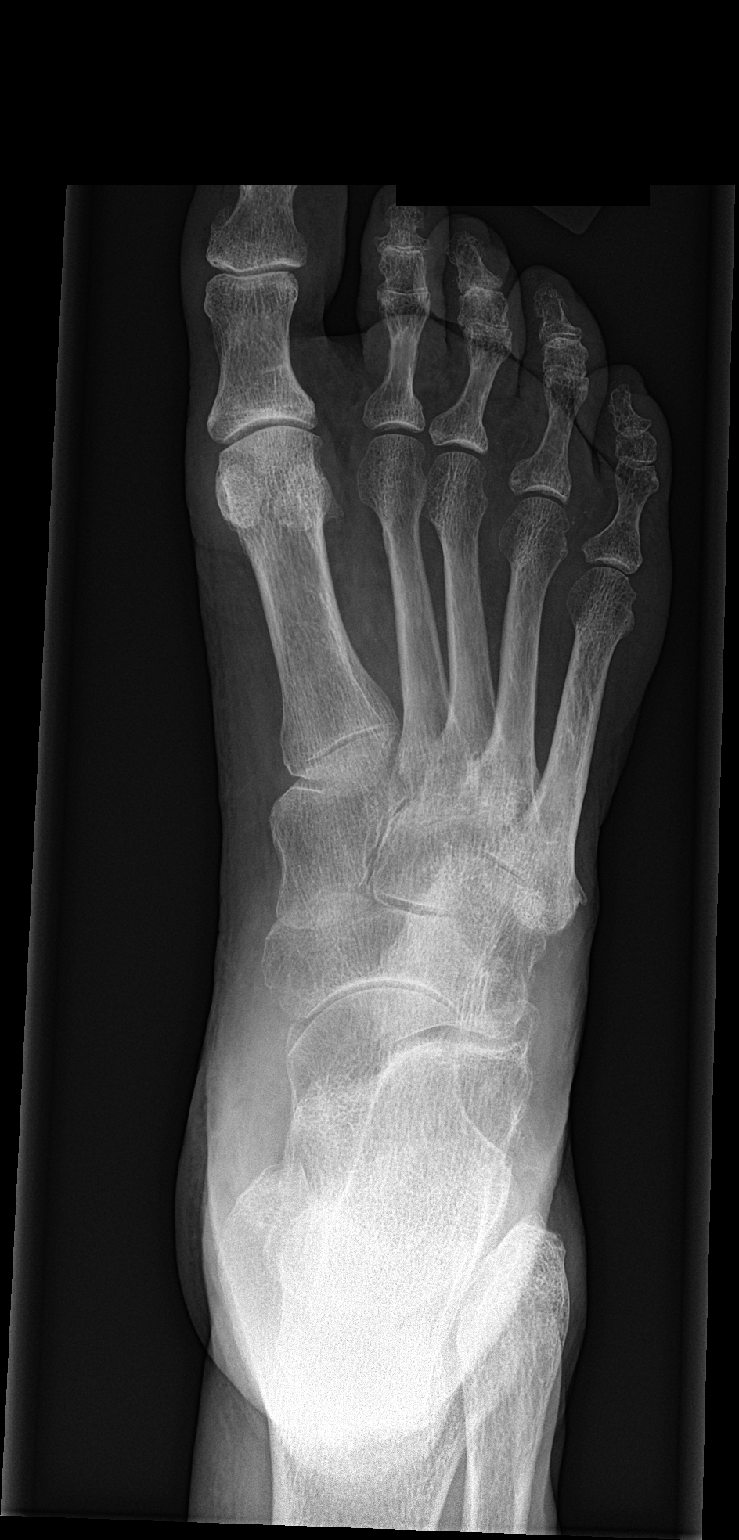

[foot obl]
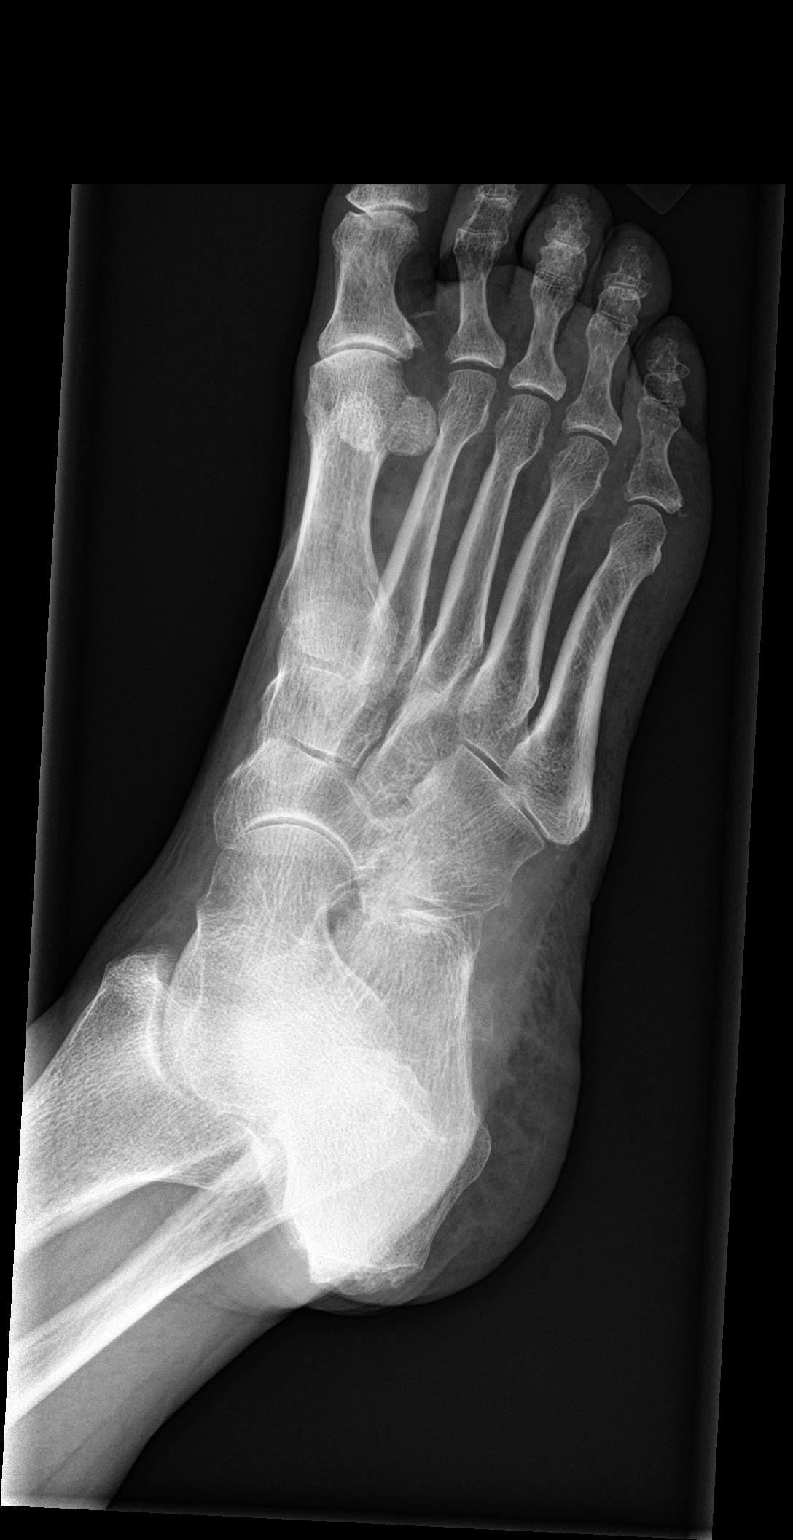

[Series 3: foot lat · 0.14mm/px · 2 of 2 slices shown]
[im 1/2]
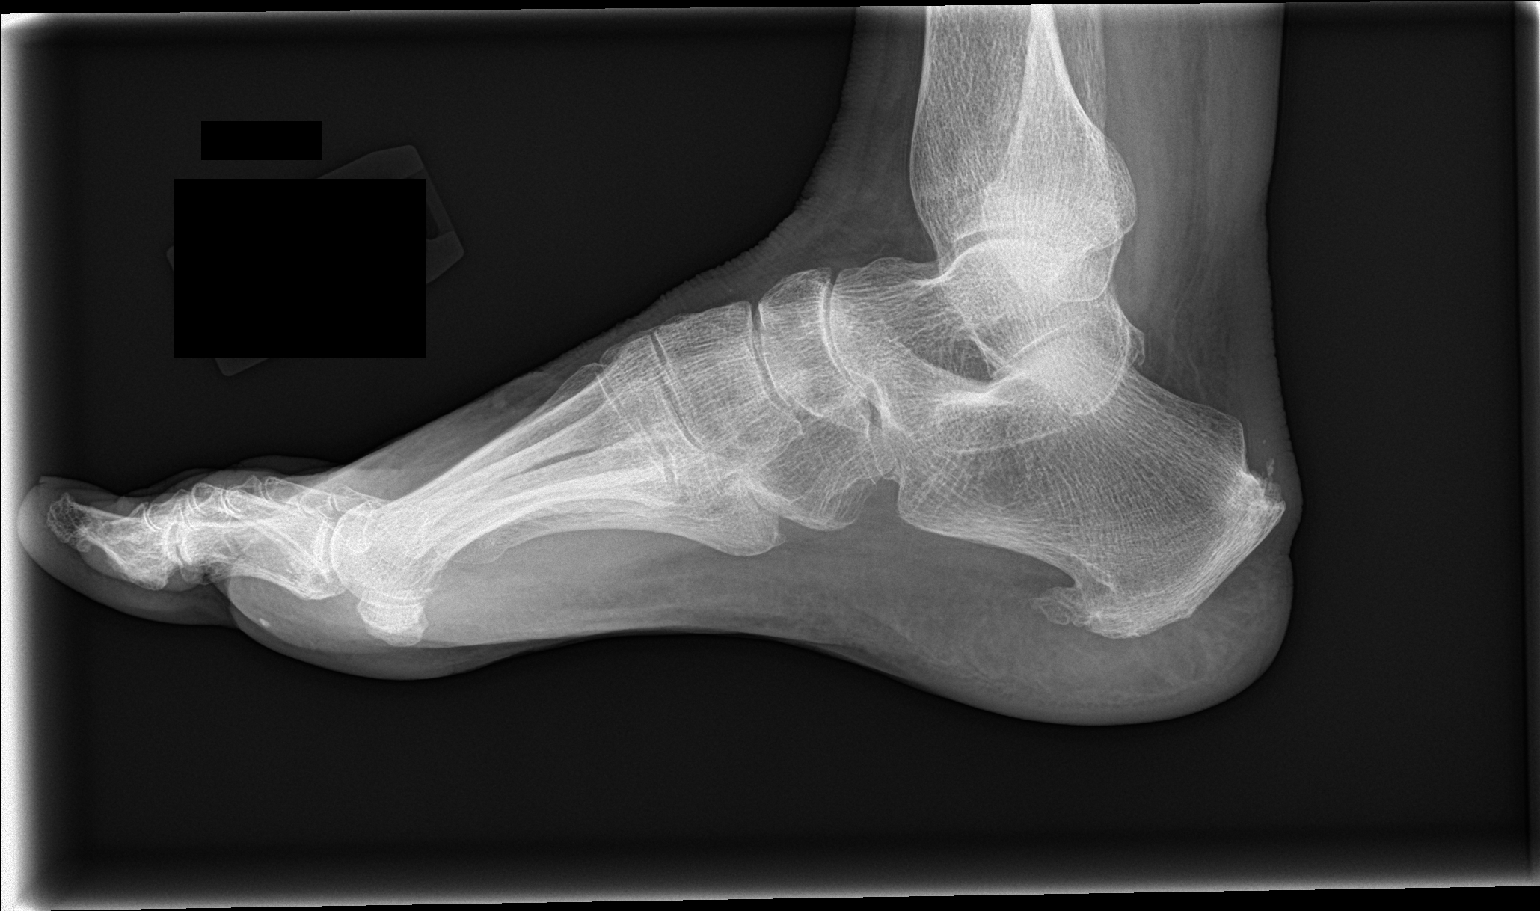
[im 2/2]
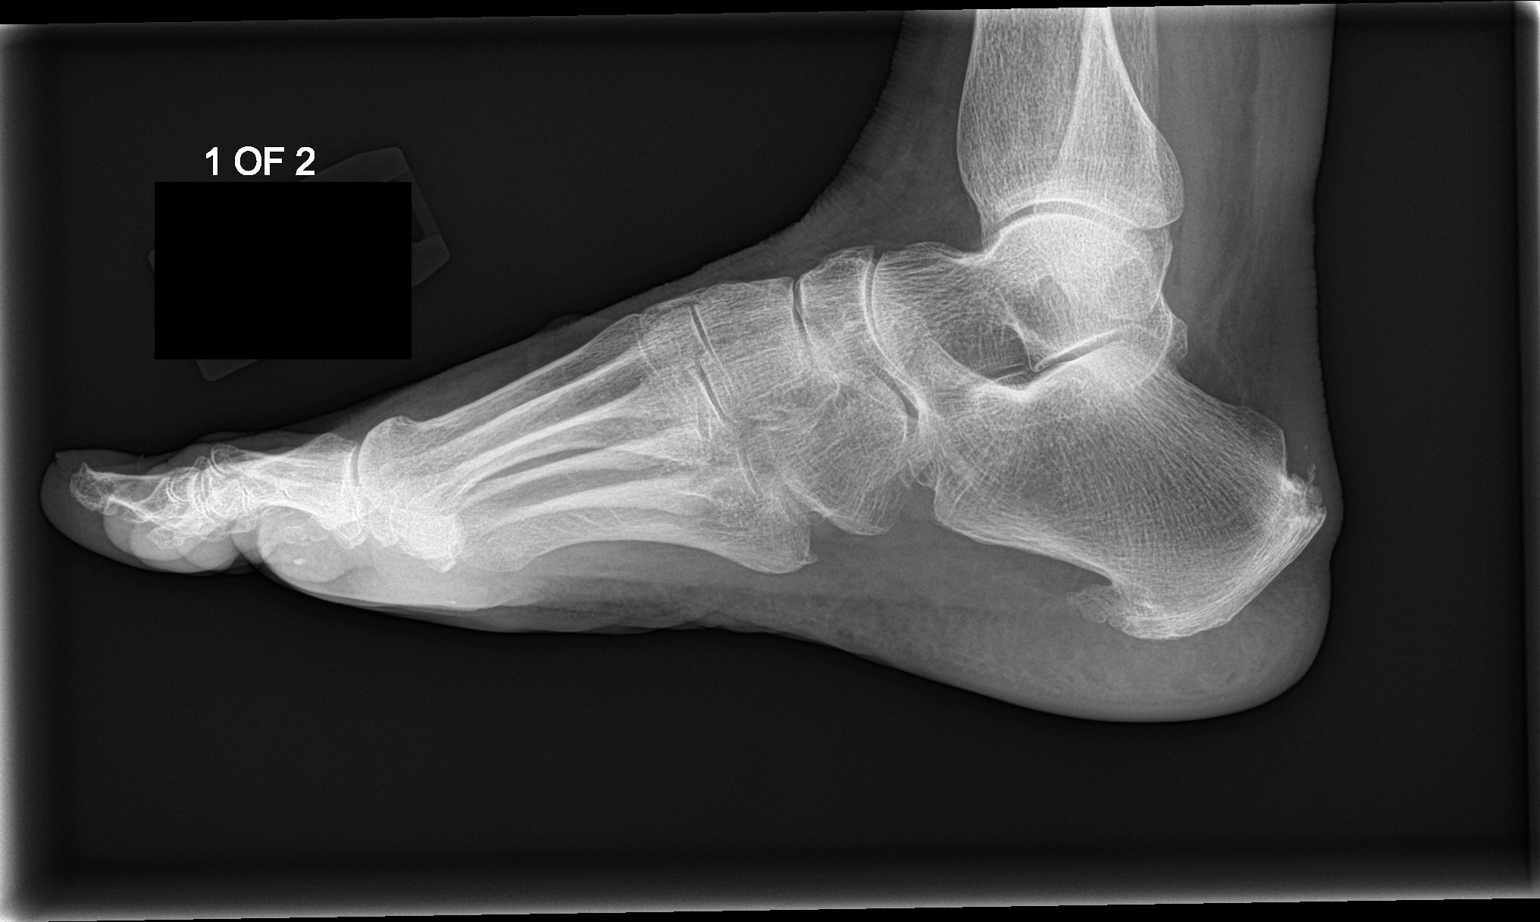

[4 of 4 positions shown; findings below may reference images not displayed]

FINDINGS: Negative for fracture. Moderate spurring in the calcaneus. Mild
degenerative change first MTP.
IMPRESSION: No acute abnormality.

## 2019-09-28 ENCOUNTER — Ambulatory Visit: Payer: Medicare Other | Attending: Internal Medicine

## 2019-09-28 ENCOUNTER — Other Ambulatory Visit: Payer: Self-pay

## 2019-09-28 DIAGNOSIS — Z23 Encounter for immunization: Secondary | ICD-10-CM

## 2019-09-28 NOTE — Progress Notes (Signed)
   Covid-19 Vaccination Clinic  Name:  Cathy Lawson    MRN: [id number] DOB: 1956-06-09  09/28/2019  [name] was observed post Covid-19 immunization for 15 minutes without incident. She was provided with Vaccine Information Sheet and instruction to access the V-Safe system.   [name] was instructed to call 911 with any severe reactions post vaccine: Marland Kitchen Difficulty breathing  . Swelling of face and throat  . A fast heartbeat  . A bad rash all over body  . Dizziness and weakness   Immunizations Administered    Name Date Dose VIS Date Route   Pfizer COVID-19 Vaccine 09/28/2019 11:01 AM 0.3 mL 06/12/2019 Intramuscular   Manufacturer: [employer], [employer]   Lot: [id number]   NDC: [address]-3

## 2019-10-21 ENCOUNTER — Ambulatory Visit: Payer: Medicare HMO | Attending: Internal Medicine

## 2019-10-21 DIAGNOSIS — Z23 Encounter for immunization: Secondary | ICD-10-CM

## 2019-10-21 NOTE — Progress Notes (Signed)
   Covid-19 Vaccination Clinic  Name:  ZOHRA CLAVEL    MRN: [id number] DOB: 19-Oct-1955  10/21/2019  Ms. Dack was observed post Covid-19 immunization for 15 minutes without incident. She was provided with Vaccine Information Sheet and instruction to access the V-Safe system.   Ms. Allinson was instructed to call 911 with any severe reactions post vaccine: Marland Kitchen Difficulty breathing  . Swelling of face and throat  . A fast heartbeat  . A bad rash all over body  . Dizziness and weakness   Immunizations Administered    Name Date Dose VIS Date Route   Pfizer COVID-19 Vaccine 10/21/2019 12:25 PM 0.3 mL 08/26/2018 Intramuscular   Manufacturer: [employer], [employer]   Lot: [id number]   NDC: [address]-9
